# Patient Record
Sex: Male | Born: 1995 | Race: White | Hispanic: No | Marital: Single | Smoking: Never smoker
Health system: Southern US, Community
[De-identification: ages and names within clinical notes are randomized; demographics above are authoritative.]

## PROBLEM LIST (undated history)

## (undated) DIAGNOSIS — E785 Hyperlipidemia, unspecified: Secondary | ICD-10-CM

## (undated) DIAGNOSIS — E049 Nontoxic goiter, unspecified: Secondary | ICD-10-CM

## (undated) HISTORY — DX: Hyperlipidemia, unspecified: E78.5

## (undated) HISTORY — DX: Nontoxic goiter, unspecified: E04.9

---
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
## 2022-08-27 ENCOUNTER — Other Ambulatory Visit: Payer: Self-pay

## 2022-08-27 ENCOUNTER — Emergency Department
Admission: EM | Admit: 2022-08-27 | Discharge: 2022-08-27 | Discharge status: Against Medical Advice | Payer: Self-pay | Attending: Emergency Medicine | Admitting: Emergency Medicine

## 2022-08-27 DIAGNOSIS — R109 Unspecified abdominal pain: Secondary | ICD-10-CM | POA: Insufficient documentation

## 2022-08-27 DIAGNOSIS — Z5321 Procedure and treatment not carried out due to patient leaving prior to being seen by health care provider: Secondary | ICD-10-CM | POA: Insufficient documentation

## 2022-08-27 DIAGNOSIS — R112 Nausea with vomiting, unspecified: Secondary | ICD-10-CM | POA: Insufficient documentation

## 2022-08-27 LAB — URINALYSIS, ROUTINE W REFLEX MICROSCOPIC
Bacteria, UA: NONE SEEN
Bilirubin Urine: NEGATIVE
Glucose, UA: NEGATIVE mg/dL
Hgb urine dipstick: NEGATIVE
Ketones, ur: 5 mg/dL — AB
Leukocytes,Ua: NEGATIVE
Nitrite: NEGATIVE
Protein, ur: 30 mg/dL — AB
Specific Gravity, Urine: 1.029 (ref 1.005–1.030)
pH: 7 (ref 5.0–8.0)

## 2022-08-27 LAB — CBC
HCT: 44 % (ref 39.0–52.0)
Hemoglobin: 14.7 g/dL (ref 13.0–17.0)
MCH: 29.9 pg (ref 26.0–34.0)
MCHC: 33.4 g/dL (ref 30.0–36.0)
MCV: 89.6 fL (ref 80.0–100.0)
Platelets: 211 10*3/uL (ref 150–400)
RBC: 4.91 MIL/uL (ref 4.22–5.81)
RDW: 12.1 % (ref 11.5–15.5)
WBC: 12.2 10*3/uL — ABNORMAL HIGH (ref 4.0–10.5)
nRBC: 0 % (ref 0.0–0.2)

## 2022-08-27 LAB — COMPREHENSIVE METABOLIC PANEL
ALT: 31 U/L (ref 0–44)
AST: 28 U/L (ref 15–41)
Albumin: 4.5 g/dL (ref 3.5–5.0)
Alkaline Phosphatase: 52 U/L (ref 38–126)
Anion gap: 13 (ref 5–15)
BUN: 23 mg/dL — ABNORMAL HIGH (ref 6–20)
CO2: 24 mmol/L (ref 22–32)
Calcium: 9.4 mg/dL (ref 8.9–10.3)
Chloride: 102 mmol/L (ref 98–111)
Creatinine, Ser: 1.19 mg/dL (ref 0.61–1.24)
GFR, Estimated: >60 mL/min (ref >60)
Glucose, Bld: 114 mg/dL — ABNORMAL HIGH (ref 70–99)
Potassium: 3.7 mmol/L (ref 3.5–5.1)
Sodium: 139 mmol/L (ref 135–145)
Total Bilirubin: 1.1 mg/dL (ref 0.3–1.2)
Total Protein: 7.1 g/dL (ref 6.5–8.1)

## 2022-08-27 LAB — LIPASE, BLOOD: Lipase: 43 U/L (ref 11–51)

## 2022-08-27 MED ORDER — ONDANSETRON 4 MG PO TBDP
4.0000 mg | ORAL_TABLET | Freq: Once | ORAL | Status: AC | PRN
  Administered 2022-08-27: 4 mg via ORAL
  Filled 2022-08-27: qty 1

## 2022-08-27 NOTE — ED Triage Notes (Signed)
Pt presents with N/V that started after he ate lunch. Pt states he thinks he ate something he was allergic to. Pt states it was pulled pork. Pt states these symptoms are similar to previous reactions. Denies rash, pruritus, or ShOB.
